# Patient Record
Sex: Male | Born: 1985 | Race: White | Hispanic: No | Marital: Married | Smoking: Never smoker
Health system: Southern US, Community
[De-identification: ages and names within clinical notes are randomized; demographics above are authoritative.]

## PROBLEM LIST (undated history)

## (undated) DIAGNOSIS — R0600 Dyspnea, unspecified: Secondary | ICD-10-CM

## (undated) DIAGNOSIS — R079 Chest pain, unspecified: Secondary | ICD-10-CM

## (undated) DIAGNOSIS — U071 COVID-19: Secondary | ICD-10-CM

## (undated) HISTORY — DX: Chest pain, unspecified: R07.9

## (undated) HISTORY — PX: WISDOM TOOTH EXTRACTION: SHX21

## (undated) HISTORY — DX: COVID-19: U07.1

## (undated) HISTORY — DX: Dyspnea, unspecified: R06.00

---

## 2013-12-26 ENCOUNTER — Ambulatory Visit (INDEPENDENT_AMBULATORY_CARE_PROVIDER_SITE_OTHER): Payer: 59 | Admitting: Physician Assistant

## 2013-12-26 VITALS — BP 110/68 | HR 67 | Temp 98.1°F | Resp 18 | Ht 70.5 in | Wt 155.0 lb

## 2013-12-26 DIAGNOSIS — M545 Low back pain, unspecified: Secondary | ICD-10-CM

## 2013-12-26 MED ORDER — TRAMADOL HCL 50 MG PO TABS: 50.0000 mg | ORAL_TABLET | Freq: Three times a day (TID) | ORAL | Status: DC | PRN

## 2013-12-26 MED ORDER — CYCLOBENZAPRINE HCL 10 MG PO TABS: 10.0000 mg | ORAL_TABLET | Freq: Three times a day (TID) | ORAL | Status: DC | PRN

## 2013-12-26 MED ORDER — MELOXICAM 15 MG PO TABS: 15.0000 mg | ORAL_TABLET | Freq: Every day | ORAL | Status: DC

## 2013-12-26 NOTE — Patient Instructions (Signed)
Take the meloxicam (Mobic) once daily - this will help with both pain and inflammation.  Do not take any additional advil or aleve with this medication as they work the same way, and I don't want to increase risk for adverse effects.  You can continue taking 1000mg  acetaminophen every 8 hours  Take the cyclobenzaprine (Flexeril) every 8 hours as needed.  This is a muscle relaxer.  It may make you sleepy, so be careful with the first dose.  If it makes you really sleepy, just use at bed time  Use the tramadol (Ultram) every 8 hours if needed for moderate to severe pain.  Ok to take on top of Mobic and Tylenol  I have put in referrals to see an orthopedic back specialist as well as a physical therapist

## 2013-12-26 NOTE — Progress Notes (Signed)
Subjective:    Patient ID: Donald Mcdaniel, male    DOB: December 01, 1985, 28 y.o.   MRN: [id number]   HPI   Donald Mcdaniel is a pleasant 28 yr old male here with concern for worsening low back pain.  Reports Donald Mcdaniel is having "pretty considerable" lower back pain.  This started in 2007 or 2008 when Donald Mcdaniel was in the [employer].  Has had ongoing low grade pain since this.  Worsening in the last 1-2 months.  Now with daily pain.  Pain is currently 2/10 but gets up to 7-8/10.  Donald Mcdaniel denies any injury within the last 2 months but states Donald Mcdaniel was being more active and lifting more.  Donald Mcdaniel denies radiation of pain.  No numbness, tingling, weakness.  No gait disturbance.  No change in bowel or bladder control.  Pain never completely goes away.  Worse with certain movements - putting on socks, rolling over in bed.  Lifting and bending make pain worse.  Donald Mcdaniel works as a Psychologist, occupationalwelder.  Donald Mcdaniel has taken 800mg  ibuprofen without relief.  Donald Mcdaniel gets some relief from 1000mg  tylenol   Review of Systems  Constitutional: Negative for fever and chills.  HENT: Negative.   Respiratory: Negative.   Cardiovascular: Negative.   Gastrointestinal: Negative.   Musculoskeletal: Positive for back pain. Negative for gait problem.  Skin: Negative.   Neurological: Negative for weakness and numbness.       Objective:   Physical Exam  Vitals reviewed. Constitutional: Donald Mcdaniel is oriented to person, place, and time. Donald Mcdaniel appears well-developed and well-nourished. No distress.  HENT:  Head: Normocephalic and atraumatic.  Eyes: Conjunctivae are normal. No scleral icterus.  Cardiovascular: Normal rate, regular rhythm and normal heart sounds.   Pulmonary/Chest: Effort normal and breath sounds normal. Donald Mcdaniel has no wheezes. Donald Mcdaniel has no rales.  Musculoskeletal:       Cervical back: Normal.       Thoracic back: Normal.       Lumbar back: Donald Mcdaniel exhibits decreased range of motion, tenderness, pain and spasm. Donald Mcdaniel exhibits no bony tenderness.  TTP over lumbar spine and paraspinals; flexion  significantly limited by pain; extension and rotation also limited but to a lesser extent  Neurological: Donald Mcdaniel is alert and oriented to person, place, and time. Donald Mcdaniel has normal strength. No sensory deficit.  Reflex Scores:      Bicep reflexes are 2+ on the right side and 2+ on the left side.      Patellar reflexes are 3+ on the right side and 3+ on the left side.      Achilles reflexes are 3+ on the right side. Skin: Skin is warm and dry.  Psychiatric: Donald Mcdaniel has a normal mood and affect. His behavior is normal.       Assessment & Plan:  Low back pain - Plan: meloxicam (MOBIC) 15 MG tablet, cyclobenzaprine (FLEXERIL) 10 MG tablet, traMADol (ULTRAM) 50 MG tablet, Ambulatory referral to Orthopedic Surgery, Ambulatory referral to Physical Therapy   Donald Mcdaniel is a pleasant 28 yr old male here with chronic low back pain, worsening in the last 2 months.  On exam there is tenderness over the low back.  His ROM is limited.  Donald Mcdaniel has no alarm symptoms.  Will start pt on Mobic, Flexeril, and tramadol prn.  Will refer to ortho spine as well as physical therapy.  Discussed with pt that I think an aggressive PT regimen may be very beneficial to him and help him achieve a better quality of life.  Donald Mcdaniel  agrees with this plan.  If worsening or not improving prior to referrals, Donald Mcdaniel will call or RTC   E. Frances Furbish MHS, PA-C [facility] 3/24/20159:38 PM

## 2016-07-19 ENCOUNTER — Encounter (HOSPITAL_COMMUNITY): Payer: Self-pay

## 2016-07-19 DIAGNOSIS — N451 Epididymitis: Secondary | ICD-10-CM | POA: Insufficient documentation

## 2016-07-19 LAB — LIPASE, BLOOD: Lipase: 24 U/L (ref 11–51)

## 2016-07-19 LAB — URINALYSIS, ROUTINE W REFLEX MICROSCOPIC
Bilirubin Urine: NEGATIVE
GLUCOSE, UA: NEGATIVE mg/dL
Hgb urine dipstick: NEGATIVE
Ketones, ur: 15 mg/dL — AB
Leukocytes, UA: NEGATIVE
NITRITE: NEGATIVE
Protein, ur: NEGATIVE mg/dL
Specific Gravity, Urine: 1.014 (ref 1.005–1.030)
pH: 7.5 (ref 5.0–8.0)

## 2016-07-19 LAB — COMPREHENSIVE METABOLIC PANEL
ALBUMIN: 4.6 g/dL (ref 3.5–5.0)
ALK PHOS: 48 U/L (ref 38–126)
ALT: 20 U/L (ref 17–63)
AST: 22 U/L (ref 15–41)
Anion gap: 12 (ref 5–15)
BILIRUBIN TOTAL: 1.7 mg/dL — AB (ref 0.3–1.2)
BUN: 10 mg/dL (ref 6–20)
CALCIUM: 9.9 mg/dL (ref 8.9–10.3)
CO2: 24 mmol/L (ref 22–32)
Chloride: 101 mmol/L (ref 101–111)
Creatinine, Ser: 0.97 mg/dL (ref 0.61–1.24)
GFR calc Af Amer: >60 mL/min (ref >60)
GFR calc non Af Amer: >60 mL/min (ref >60)
GLUCOSE: 115 mg/dL — AB (ref 65–99)
Potassium: 3.6 mmol/L (ref 3.5–5.1)
Sodium: 137 mmol/L (ref 135–145)
TOTAL PROTEIN: 8.1 g/dL (ref 6.5–8.1)

## 2016-07-19 LAB — CBC
HEMATOCRIT: 47.1 % (ref 39.0–52.0)
Hemoglobin: 16.9 g/dL (ref 13.0–17.0)
MCH: 30.5 pg (ref 26.0–34.0)
MCHC: 35.9 g/dL (ref 30.0–36.0)
MCV: 85 fL (ref 78.0–100.0)
Platelets: 189 10*3/uL (ref 150–400)
RBC: 5.54 MIL/uL (ref 4.22–5.81)
RDW: 12.2 % (ref 11.5–15.5)
WBC: 8.5 10*3/uL (ref 4.0–10.5)

## 2016-07-19 MED ORDER — FENTANYL CITRATE (PF) 100 MCG/2ML IJ SOLN
50.0000 ug | INTRAMUSCULAR | Status: AC | PRN
Start: 2016-07-19 — End: 2016-07-20
  Administered 2016-07-19 – 2016-07-20 (×2): 50 ug via NASAL

## 2016-07-19 MED ORDER — ONDANSETRON 4 MG PO TBDP
ORAL_TABLET | ORAL | Status: AC
  Filled 2016-07-19: qty 1

## 2016-07-19 MED ORDER — FENTANYL CITRATE (PF) 100 MCG/2ML IJ SOLN
INTRAMUSCULAR | Status: AC
  Filled 2016-07-19: qty 2

## 2016-07-19 MED ORDER — ONDANSETRON 4 MG PO TBDP
4.0000 mg | ORAL_TABLET | Freq: Once | ORAL | Status: AC | PRN
  Administered 2016-07-19: 4 mg via ORAL

## 2016-07-19 NOTE — ED Triage Notes (Signed)
Pt complaining of R upper abdominal pain. Pt state pain radiates to groin and testicles. Pt admits to pain with urination and multiple bowel movements today. Pt admits to nausea and vomiting today.

## 2016-07-20 ENCOUNTER — Emergency Department (HOSPITAL_COMMUNITY): Payer: Self-pay

## 2016-07-20 ENCOUNTER — Emergency Department (HOSPITAL_COMMUNITY)
Admission: EM | Admit: 2016-07-20 | Discharge: 2016-07-20 | Disposition: A | Discharge status: Home / Self Care | Payer: Self-pay | Attending: Emergency Medicine | Admitting: Emergency Medicine

## 2016-07-20 DIAGNOSIS — R1012 Left upper quadrant pain: Secondary | ICD-10-CM

## 2016-07-20 DIAGNOSIS — N451 Epididymitis: Secondary | ICD-10-CM

## 2016-07-20 MED ORDER — FENTANYL CITRATE (PF) 100 MCG/2ML IJ SOLN
INTRAMUSCULAR | Status: AC
  Filled 2016-07-20: qty 2

## 2016-07-20 MED ORDER — ONDANSETRON HCL 4 MG/2ML IJ SOLN
4.0000 mg | Freq: Once | INTRAMUSCULAR | Status: AC
  Administered 2016-07-20: 4 mg via INTRAVENOUS
  Filled 2016-07-20: qty 2

## 2016-07-20 MED ORDER — MORPHINE SULFATE (PF) 4 MG/ML IV SOLN
4.0000 mg | Freq: Once | INTRAVENOUS | Status: AC
  Administered 2016-07-20: 4 mg via INTRAVENOUS
  Filled 2016-07-20: qty 1

## 2016-07-20 MED ORDER — HYDROCODONE-ACETAMINOPHEN 5-325 MG PO TABS: 1.0000 | ORAL_TABLET | ORAL | 0 refills | Status: DC | PRN

## 2016-07-20 MED ORDER — IBUPROFEN 800 MG PO TABS: 800.0000 mg | ORAL_TABLET | Freq: Three times a day (TID) | ORAL | 0 refills | Status: DC

## 2016-07-20 MED ORDER — CEFTRIAXONE SODIUM 250 MG IJ SOLR
250.0000 mg | Freq: Once | INTRAMUSCULAR | Status: AC
  Administered 2016-07-20: 250 mg via INTRAMUSCULAR
  Filled 2016-07-20: qty 250

## 2016-07-20 MED ORDER — AZITHROMYCIN 250 MG PO TABS
1000.0000 mg | ORAL_TABLET | Freq: Once | ORAL | Status: AC
  Administered 2016-07-20: 1000 mg via ORAL
  Filled 2016-07-20: qty 4

## 2016-07-20 MED ORDER — SULFAMETHOXAZOLE-TRIMETHOPRIM 800-160 MG PO TABS: 1.0000 | ORAL_TABLET | Freq: Two times a day (BID) | ORAL | 0 refills | Status: AC

## 2016-07-20 MED ORDER — SODIUM CHLORIDE 0.9 % IV BOLUS (SEPSIS)
500.0000 mL | Freq: Once | INTRAVENOUS | Status: AC
  Administered 2016-07-20: 500 mL via INTRAVENOUS

## 2016-07-20 NOTE — ED Provider Notes (Signed)
[facility]-EMERGENCY DEPT Provider Note   CSN: [id number] Arrival date & time: 07/19/16  2202     History   Chief Complaint Chief Complaint  Patient presents with  . Abdominal Pain  . Groin Pain    HPI Donald Mcdaniel is a 30 y.o. male.  Patient presents to the emergency department for evaluation of abdominal pain. Symptoms began yesterday. Patient reports a constant burning pain in the left upper abdomen. He has had nausea and vomiting. Patient also reports some loose stools. Pain does radiate to the left groin area at times and he has noticed some burning with urination. No hematuria.      History reviewed. No pertinent past medical history.  There are no active problems to display for this patient.   Past Surgical History:  Procedure Laterality Date  . WISDOM TOOTH EXTRACTION         Home Medications    Prior to Admission medications   Medication Sig Start Date End Date Taking? Authorizing Provider  cyclobenzaprine (FLEXERIL) 10 MG tablet Take 1 tablet (10 mg total) by mouth 3 (three) times daily as needed for muscle spasms. 12/26/13   Eleanore Delia Chimes, PA-C  HYDROcodone-acetaminophen (NORCO/VICODIN) 5-325 MG tablet Take 1-2 tablets by mouth every 4 (four) hours as needed for moderate pain. 07/20/16   Gilda Crease, MD  ibuprofen (ADVIL,MOTRIN) 800 MG tablet Take 1 tablet (800 mg total) by mouth 3 (three) times daily. 07/20/16   Gilda Crease, MD  meloxicam (MOBIC) 15 MG tablet Take 1 tablet (15 mg total) by mouth daily. 12/26/13   Eleanore Delia Chimes, PA-C  sulfamethoxazole-trimethoprim (BACTRIM DS,SEPTRA DS) 800-160 MG tablet Take 1 tablet by mouth 2 (two) times daily. 07/20/16 07/27/16  Gilda Crease, MD  traMADol (ULTRAM) 50 MG tablet Take 1 tablet (50 mg total) by mouth every 8 (eight) hours as needed. 12/26/13   Godfrey Pick, PA-C    Family History Family History  Problem Relation Age of Onset  . Diabetes Mother   . Mental illness Mother   .  Hyperlipidemia Father   . Cancer Maternal Grandmother   . Diabetes Maternal Grandmother   . Cancer Paternal Grandfather     Social History Social History  Substance Use Topics  . Smoking status: Never Smoker  . Smokeless tobacco: Never Used  . Alcohol use Yes     Allergies   Review of patient's allergies indicates no known allergies.   Review of Systems Review of Systems  Gastrointestinal: Positive for abdominal pain, nausea and vomiting.  Genitourinary: Positive for dysuria and testicular pain. Negative for discharge, hematuria and penile pain.  All other systems reviewed and are negative.    Physical Exam Updated Vital Signs BP 127/86   Pulse 89   Temp 98.4 F (36.9 C) (Oral)   Resp 18   SpO2 98%   Physical Exam  Constitutional: He is oriented to person, place, and time. He appears well-developed and well-nourished. No distress.  HENT:  Head: Normocephalic and atraumatic.  Right Ear: Hearing normal.  Left Ear: Hearing normal.  Nose: Nose normal.  Mouth/Throat: Oropharynx is clear and moist and mucous membranes are normal.  Eyes: Conjunctivae and EOM are normal. Pupils are equal, round, and reactive to light.  Neck: Normal range of motion. Neck supple.  Cardiovascular: Regular rhythm, S1 normal and S2 normal.  Exam reveals no gallop and no friction rub.   No murmur heard. Pulmonary/Chest: Effort normal and breath sounds normal. No respiratory distress. He exhibits  no tenderness.  Abdominal: Soft. Normal appearance and bowel sounds are normal. There is no hepatosplenomegaly. There is no tenderness. There is no rebound, no guarding, no tenderness at McBurney's point and negative Murphy's sign. No hernia. Hernia confirmed negative in the right inguinal area and confirmed negative in the left inguinal area.  Genitourinary: Penis normal. Right testis shows no mass, no swelling and no tenderness. Left testis shows tenderness (Epididymis). Left testis shows no mass and no  swelling. Cremasteric reflex is not absent on the left side. Circumcised.  Musculoskeletal: Normal range of motion.  Neurological: He is alert and oriented to person, place, and time. He has normal strength. No cranial nerve deficit or sensory deficit. Coordination normal. GCS eye subscore is 4. GCS verbal subscore is 5. GCS motor subscore is 6.  Skin: Skin is warm, dry and intact. No rash noted. No cyanosis.  Psychiatric: He has a normal mood and affect. His speech is normal and behavior is normal. Thought content normal.  Nursing note and vitals reviewed.    ED Treatments / Results  Labs (all labs ordered are listed, but only abnormal results are displayed) Labs Reviewed  COMPREHENSIVE METABOLIC PANEL - Abnormal; Notable for the following:       Result Value   Glucose, Bld 115 (*)    Total Bilirubin 1.7 (*)    All other components within normal limits  URINALYSIS, ROUTINE W REFLEX MICROSCOPIC (NOT AT [facility]) - Abnormal; Notable for the following:    Ketones, ur 15 (*)    All other components within normal limits  LIPASE, BLOOD  CBC    EKG  EKG Interpretation None       Radiology Kore[place] Scrotum  Result Date: 07/20/2016 CLINICAL DATA:  30 y/o M; abdominal pain radiating to the testicles. EXAM: SCROTAL ULTRASOUND DOPPLER ULTRASOUND OF THE TESTICLES TECHNIQUE: Complete ultrasound examination of the testicles, epididymis, and other scrotal structures was performed. Color and spectral Doppler ultrasound were also utilized to evaluate blood flow to the testicles. COMPARISON:  None. FINDINGS: Right testicle Measurements: 4.9 x 2.5 x 3.4 cm. No mass or microlithiasis visualized. Left testicle Measurements: 4.9 x 2.6 x 3.1 cm. No mass or microlithiasis visualized. Right epididymis:  Normal in size and appearance. Left epididymis:  Normal in size and appearance. Hydrocele:  None visualized. Varicocele:  None visualized. Pulsed Doppler interrogation of both testes demonstrates normal low  resistance arterial and venous waveforms bilaterally. IMPRESSION: No acute process identified. No evidence of testicular torsion or epididymo-orchitis. Electronically Signed   By: Mitzi HansenLance  Furusawa-Stratton M.D.   On: 07/20/2016 06:52   [place] Art/ven Flow Abd Pelv Doppler  Result Date: 07/20/2016 CLINICAL DATA:  30 y/o M; abdominal pain radiating to the testicles. EXAM: SCROTAL ULTRASOUND DOPPLER ULTRASOUND OF THE TESTICLES TECHNIQUE: Complete ultrasound examination of the testicles, epididymis, and other scrotal structures was performed. Color and spectral Doppler ultrasound were also utilized to evaluate blood flow to the testicles. COMPARISON:  None. FINDINGS: Right testicle Measurements: 4.9 x 2.5 x 3.4 cm. No mass or microlithiasis visualized. Left testicle Measurements: 4.9 x 2.6 x 3.1 cm. No mass or microlithiasis visualized. Right epididymis:  Normal in size and appearance. Left epididymis:  Normal in size and appearance. Hydrocele:  None visualized. Varicocele:  None visualized. Pulsed Doppler interrogation of both testes demonstrates normal low resistance arterial and venous waveforms bilaterally. IMPRESSION: No acute process identified. No evidence of testicular torsion or epididymo-orchitis. Electronically Signed   By: Mitzi HansenLance  Furusawa-Stratton M.D.   On: 07/20/2016 06:52  Procedures Procedures (including critical care time)  Medications Ordered in ED Medications  ondansetron (ZOFRAN-ODT) 4 MG disintegrating tablet (not administered)  fentaNYL (SUBLIMAZE) 100 MCG/2ML injection (not administered)  ondansetron (ZOFRAN-ODT) disintegrating tablet 4 mg (4 mg Oral Given 07/19/16 2222)  fentaNYL (SUBLIMAZE) injection 50 mcg (50 mcg Nasal Given 07/20/16 0051)  sodium chloride 0.9 % bolus 500 mL (500 mLs Intravenous New Bag/Given 07/20/16 0557)  morphine 4 MG/ML injection 4 mg (4 mg Intravenous Given 07/20/16 0557)  ondansetron (ZOFRAN) injection 4 mg (4 mg Intravenous Given 07/20/16 0557)      Initial Impression / Assessment and Plan / ED Course  I have reviewed the triage vital signs and the nursing notes.  Pertinent labs & imaging results that were available during my care of the patient were reviewed by me and considered in my medical decision making (see chart for details).  Clinical Course   Patient presents with complaints of abdominal pain. Symptoms began yesterday. He initially had burning pain in his left upper abdomen that then became nausea, vomiting and diarrhea. Pain has become more diffuse across the abdomen and shoots down into the groin area. Examination did reveal some tenderness of the left epididymis region without any testicular swelling, redness or scrotal edema or erythema. Ultrasound shows no evidence of torsion. Ultrasound was essentially normal. Based on his examination, however, will treat for epididymitis. Patient has a benign abdominal exam. He had some mild tenderness in the left upper quadrant, no right upper quadrant tenderness, negative Murphy's sign. No lower abdominal tenderness, negative tenderness at McBurney's point. No signs of acute surgical process.  Final Clinical Impressions(s) / ED Diagnoses   Final diagnoses:  Left upper quadrant pain  Epididymitis    New Prescriptions New Prescriptions   HYDROCODONE-ACETAMINOPHEN (NORCO/VICODIN) 5-325 MG TABLET    Take 1-2 tablets by mouth every 4 (four) hours as needed for moderate pain.   IBUPROFEN (ADVIL,MOTRIN) 800 MG TABLET    Take 1 tablet (800 mg total) by mouth 3 (three) times daily.   SULFAMETHOXAZOLE-TRIMETHOPRIM (BACTRIM DS,SEPTRA DS) 800-160 MG TABLET    Take 1 tablet by mouth 2 (two) times daily.     Gilda Crease, MD 07/20/16 [phone]

## 2019-05-30 ENCOUNTER — Ambulatory Visit
Admission: RE | Admit: 2019-05-30 | Discharge: 2019-05-30 | Disposition: A | Discharge status: Home / Self Care | Payer: BC Managed Care – PPO | Source: Ambulatory Visit | Attending: Family Medicine | Admitting: Family Medicine

## 2019-05-30 ENCOUNTER — Other Ambulatory Visit: Payer: Self-pay | Admitting: Family Medicine

## 2019-05-30 DIAGNOSIS — R0689 Other abnormalities of breathing: Secondary | ICD-10-CM

## 2019-06-06 ENCOUNTER — Other Ambulatory Visit: Payer: Self-pay | Admitting: Family Medicine

## 2019-06-06 DIAGNOSIS — R079 Chest pain, unspecified: Secondary | ICD-10-CM

## 2019-06-06 DIAGNOSIS — R0600 Dyspnea, unspecified: Secondary | ICD-10-CM

## 2019-06-07 ENCOUNTER — Other Ambulatory Visit: Payer: Self-pay

## 2019-06-07 ENCOUNTER — Ambulatory Visit
Admission: RE | Admit: 2019-06-07 | Discharge: 2019-06-07 | Disposition: A | Discharge status: Home / Self Care | Payer: BC Managed Care – PPO | Source: Ambulatory Visit | Attending: Family Medicine | Admitting: Family Medicine

## 2019-06-07 DIAGNOSIS — R079 Chest pain, unspecified: Secondary | ICD-10-CM

## 2019-06-07 DIAGNOSIS — R0600 Dyspnea, unspecified: Secondary | ICD-10-CM

## 2019-06-07 MED ORDER — IOPAMIDOL (ISOVUE-300) INJECTION 61%
75.0000 mL | Freq: Once | INTRAVENOUS | Status: AC | PRN
  Administered 2019-06-07: 75 mL via INTRAVENOUS

## 2019-07-17 NOTE — Progress Notes (Signed)
CARDIOLOGY CONSULT NOTE       Patient ID: Donald Mcdaniel MRN: [id number] DOB/AGE: 04/10/1986 33 y.o.  Admit date: (Not on file) Referring Physician: Ehinger Primary Physician: Gaynelle Arabian, MD Primary Cardiologist: New Reason for Consultation: Chest pain   Active Problems:   * No active hospital problems. *   HPI:  33 y.o. referred by Dr Marisue Humble for chest pain. Has had chest pain and dyspnea since August CXR and labs negative including inflammatory markers ESR only 2 BNP 6 Notes resting HR higher than usual ? 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 Environmental work exposure ECG dated 07/12/18 NSR normal rate 60 no signs of pericarditis CT chest 06/07/19 also normal   Reviewed over 30 pages of records from Dr Marisue Humble including office notes labs ECG and CXR/CT from [employer] imaging time alone for this 30 minutes  ROS All other systems reviewed and negative except as noted above  No past medical history on file.  Family History  Problem Relation Age of Onset  . Diabetes Mother   . Mental illness Mother   . Hyperlipidemia Father   . Cancer Maternal Grandmother   . Diabetes Maternal Grandmother   . Cancer Paternal Grandfather     Social History   Socioeconomic History  . Marital status: Married    Spouse name: Not on file  . Number of children: Not on file  . Years of education: Not on file  . Highest education level: Not on file  Occupational History  . Not on file  Social Needs  . Financial resource strain: Not on file  . Food insecurity    Worry: Not on file    Inability: Not on file  . Transportation needs    Medical: Not on file    Non-medical: Not on file  Tobacco Use  . Smoking status: Never Smoker  . Smokeless  tobacco: Never Used  Substance and Sexual Activity  . Alcohol use: Yes  . Drug use: Not on file  . Sexual activity: Not on file  Lifestyle  . Physical activity    Days per week: Not on file    Minutes per session: Not on file  . Stress: Not on file  Relationships  . Social Herbalist on phone: Not on file    Gets together: Not on file    Attends religious service: Not on file    Active member of club or organization: Not on file    Attends meetings of clubs or organizations: Not on file    Relationship status: Not on file  . Intimate partner violence    Fear of current or ex partner: Not on file    Emotionally abused: Not on file    Physically abused: Not on file    Forced sexual activity: Not on file  Other Topics Concern  . Not on file  Social History Narrative  . Not on file    Past Surgical History:  Procedure Laterality Date  . WISDOM TOOTH EXTRACTION          Physical Exam: There were no vitals taken for this visit.    Affect appropriate Healthy:  appears stated age 33: normal Neck supple with no adenopathy JVP normal no  bruits no thyromegaly Lungs clear with no wheezing and good diaphragmatic motion Heart:  S1/S2 no murmur, no rub, gallop or click PMI normal Abdomen: benighn, BS positve, no tenderness, no AAA no bruit.  No HSM or HJR Distal pulses intact with no bruits No edema Neuro non-focal Skin warm and dry No muscular weakness   Labs:   Lab Results  Component Value Date   WBC 8.5 07/19/2016   HGB 16.9 07/19/2016   HCT 47.1 07/19/2016   MCV 85.0 07/19/2016   PLT 189 07/19/2016     Radiology: No results found.  EKG: 07/19/19 NSR rate 67 normal    ASSESSMENT AND PLAN:   1. Chest Pain:  Atypical normal ECG no signs of pericarditis normal ESR f/u ETT 2. Dyspnea:  Functional normal CXR, CT and ECG f/u echo to assess for DCM that may be viral etiology  3. Palpitations: benign no signs of arrhythmia ? Related to anxiety  TSH/Hct and other labs normal observe no need for monitor   Signed: Jenkins Rouge 07/17/2019, 11:59 AM

## 2019-07-19 ENCOUNTER — Ambulatory Visit: Payer: BC Managed Care – PPO | Admitting: Cardiovascular Disease

## 2019-07-19 ENCOUNTER — Encounter: Payer: Self-pay | Admitting: Cardiovascular Disease

## 2019-07-19 ENCOUNTER — Encounter (INDEPENDENT_AMBULATORY_CARE_PROVIDER_SITE_OTHER): Payer: Self-pay

## 2019-07-19 ENCOUNTER — Other Ambulatory Visit: Payer: Self-pay

## 2019-07-19 VITALS — BP 126/76 | HR 67 | Ht 70.5 in | Wt 176.0 lb

## 2019-07-19 DIAGNOSIS — R0609 Other forms of dyspnea: Secondary | ICD-10-CM

## 2019-07-19 DIAGNOSIS — R0600 Dyspnea, unspecified: Secondary | ICD-10-CM | POA: Diagnosis not present

## 2019-07-19 DIAGNOSIS — R079 Chest pain, unspecified: Secondary | ICD-10-CM | POA: Diagnosis not present

## 2019-07-19 NOTE — Addendum Note (Signed)
Addended by: Rose Phi on: 07/19/2019 04:55 PM   Modules accepted: Orders

## 2019-07-19 NOTE — Patient Instructions (Addendum)
Medication Instructions:   If you need a refill on your cardiac medications before your next appointment, please call your pharmacy.   Lab work:  If you have labs (blood work) drawn today and your tests are completely normal, you will receive your results only by: Marland Kitchen MyChart Message (if you have MyChart) OR . A paper copy in the mail If you have any lab test that is abnormal or we need to change your treatment, we will call you to review the results.  Testing/Procedures: Your physician has requested that you have an echocardiogram. Echocardiography is a painless test that uses sound waves to create images of your heart. It provides your doctor with information about the size and shape of your heart and how well your heart's chambers and valves are working. This procedure takes approximately one hour. There are no restrictions for this procedure.  Your physician has requested that you have an exercise tolerance test. For further information please visit [email or web address]. Please also follow instruction sheet, as given.  Follow-Up: At [facility], you and your health needs are our priority.  As part of our continuing mission to provide you with exceptional heart care, we have created designated Provider Care Teams.  These Care Teams include your primary Cardiologist (physician) and Advanced Practice Providers (APPs -  Physician Assistants and Nurse Practitioners) who all work together to provide you with the care you need, when you need it. . You will need a follow up appointment as needed with Dr. Johnsie Cancel.

## 2019-07-25 ENCOUNTER — Ambulatory Visit (HOSPITAL_COMMUNITY): Payer: BC Managed Care – PPO | Attending: Cardiology

## 2019-07-25 ENCOUNTER — Other Ambulatory Visit: Payer: Self-pay

## 2019-07-25 DIAGNOSIS — R0600 Dyspnea, unspecified: Secondary | ICD-10-CM | POA: Diagnosis present

## 2019-07-25 DIAGNOSIS — R0609 Other forms of dyspnea: Secondary | ICD-10-CM

## 2019-07-25 DIAGNOSIS — R079 Chest pain, unspecified: Secondary | ICD-10-CM | POA: Insufficient documentation

## 2019-08-07 ENCOUNTER — Other Ambulatory Visit (HOSPITAL_COMMUNITY)
Admission: RE | Admit: 2019-08-07 | Discharge: 2019-08-07 | Disposition: A | Discharge status: Home / Self Care | Payer: BC Managed Care – PPO | Source: Ambulatory Visit | Attending: Cardiovascular Disease | Admitting: Cardiovascular Disease

## 2019-08-07 DIAGNOSIS — Z01812 Encounter for preprocedural laboratory examination: Secondary | ICD-10-CM | POA: Insufficient documentation

## 2019-08-07 DIAGNOSIS — Z20828 Contact with and (suspected) exposure to other viral communicable diseases: Secondary | ICD-10-CM | POA: Diagnosis not present

## 2019-08-08 LAB — NOVEL CORONAVIRUS, NAA (HOSP ORDER, SEND-OUT TO REF LAB; TAT 18-24 HRS): SARS-CoV-2, NAA: NOT DETECTED

## 2019-08-09 ENCOUNTER — Telehealth (HOSPITAL_COMMUNITY): Payer: Self-pay

## 2019-08-09 NOTE — Telephone Encounter (Signed)
Encounter complete. 

## 2019-08-10 ENCOUNTER — Other Ambulatory Visit: Payer: Self-pay

## 2019-08-10 ENCOUNTER — Ambulatory Visit (HOSPITAL_COMMUNITY)
Admission: RE | Admit: 2019-08-10 | Discharge: 2019-08-10 | Disposition: A | Discharge status: Home / Self Care | Payer: BC Managed Care – PPO | Source: Ambulatory Visit | Attending: Cardiology | Admitting: Cardiology

## 2019-08-10 DIAGNOSIS — R0600 Dyspnea, unspecified: Secondary | ICD-10-CM | POA: Diagnosis present

## 2019-08-10 DIAGNOSIS — R0609 Other forms of dyspnea: Secondary | ICD-10-CM

## 2019-08-10 DIAGNOSIS — R079 Chest pain, unspecified: Secondary | ICD-10-CM | POA: Diagnosis present

## 2019-08-11 LAB — EXERCISE TOLERANCE TEST
Estimated workload: 17.2 METS
Exercise duration (min): 14 min
Exercise duration (sec): 0 s
MPHR: 187 {beats}/min
Peak HR: 179 {beats}/min
Percent HR: 95 %
Rest HR: 73 {beats}/min

## 2021-02-25 ENCOUNTER — Other Ambulatory Visit: Payer: Self-pay

## 2021-02-25 ENCOUNTER — Ambulatory Visit (HOSPITAL_COMMUNITY)
Admission: EM | Admit: 2021-02-25 | Discharge: 2021-02-25 | Disposition: A | Discharge status: Home / Self Care | Payer: Self-pay | Attending: Family | Admitting: Family

## 2021-02-25 ENCOUNTER — Encounter (HOSPITAL_COMMUNITY): Payer: Self-pay

## 2021-02-25 ENCOUNTER — Ambulatory Visit (INDEPENDENT_AMBULATORY_CARE_PROVIDER_SITE_OTHER): Payer: Self-pay

## 2021-02-25 DIAGNOSIS — M25512 Pain in left shoulder: Secondary | ICD-10-CM

## 2021-02-25 DIAGNOSIS — S43102A Unspecified dislocation of left acromioclavicular joint, initial encounter: Secondary | ICD-10-CM
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
## 2021-02-25 NOTE — Discharge Instructions (Addendum)
Recommend continue to wear left shoulder/arm sling. May continue Ibuprofen 800mg  every 8 to 12 hours as needed for pain. May continue applying ice to area and alternate with heat for comfort. Call [facility] today to schedule appointment for follow-up.

## 2022-06-18 ENCOUNTER — Encounter (HOSPITAL_COMMUNITY): Payer: Self-pay | Admitting: *Deleted

## 2022-06-18 ENCOUNTER — Other Ambulatory Visit: Payer: Self-pay

## 2022-06-18 ENCOUNTER — Emergency Department (HOSPITAL_COMMUNITY): Payer: Self-pay

## 2022-06-18 ENCOUNTER — Observation Stay (HOSPITAL_COMMUNITY)
Admission: EM | Admit: 2022-06-18 | Discharge: 2022-06-19 | Disposition: A | Discharge status: Home / Self Care | Payer: No Typology Code available for payment source | Attending: Family Medicine | Admitting: Family Medicine

## 2022-06-18 ENCOUNTER — Emergency Department (HOSPITAL_COMMUNITY): Payer: No Typology Code available for payment source

## 2022-06-18 DIAGNOSIS — X500XXA Overexertion from strenuous movement or load, initial encounter: Secondary | ICD-10-CM | POA: Diagnosis not present

## 2022-06-18 DIAGNOSIS — Z7982 Long term (current) use of aspirin: Secondary | ICD-10-CM | POA: Diagnosis not present

## 2022-06-18 DIAGNOSIS — M545 Low back pain, unspecified: Secondary | ICD-10-CM | POA: Diagnosis not present

## 2022-06-18 DIAGNOSIS — Z79899 Other long term (current) drug therapy: Secondary | ICD-10-CM | POA: Insufficient documentation

## 2022-06-18 DIAGNOSIS — Z23 Encounter for immunization: Secondary | ICD-10-CM | POA: Diagnosis not present

## 2022-06-18 DIAGNOSIS — Z8616 Personal history of COVID-19: Secondary | ICD-10-CM | POA: Insufficient documentation

## 2022-06-18 LAB — BASIC METABOLIC PANEL
Anion gap: 10 (ref 5–15)
BUN: 18 mg/dL (ref 6–20)
CO2: 24 mmol/L (ref 22–32)
Calcium: 9.5 mg/dL (ref 8.9–10.3)
Chloride: 105 mmol/L (ref 98–111)
Creatinine, Ser: 1.05 mg/dL (ref 0.61–1.24)
GFR, Estimated: >60 mL/min (ref >60)
Glucose, Bld: 92 mg/dL (ref 70–99)
Potassium: 3.7 mmol/L (ref 3.5–5.1)
Sodium: 139 mmol/L (ref 135–145)

## 2022-06-18 LAB — CBC WITH DIFFERENTIAL/PLATELET
Abs Immature Granulocytes: 0 10*3/uL (ref 0.00–0.07)
Basophils Absolute: 0 10*3/uL (ref 0.0–0.1)
Basophils Relative: 1 %
Eosinophils Absolute: 0 10*3/uL (ref 0.0–0.5)
Eosinophils Relative: 1 %
HCT: 46.2 % (ref 39.0–52.0)
Hemoglobin: 15.9 g/dL (ref 13.0–17.0)
Immature Granulocytes: 0 %
Lymphocytes Relative: 25 %
Lymphs Abs: 1.5 10*3/uL (ref 0.7–4.0)
MCH: 30.2 pg (ref 26.0–34.0)
MCHC: 34.4 g/dL (ref 30.0–36.0)
MCV: 87.7 fL (ref 80.0–100.0)
Monocytes Absolute: 0.4 10*3/uL (ref 0.1–1.0)
Monocytes Relative: 6 %
Neutro Abs: 4.1 10*3/uL (ref 1.7–7.7)
Neutrophils Relative %: 67 %
Platelets: 178 10*3/uL (ref 150–400)
RBC: 5.27 MIL/uL (ref 4.22–5.81)
RDW: 12.5 % (ref 11.5–15.5)
WBC: 6 10*3/uL (ref 4.0–10.5)
nRBC: 0 % (ref 0.0–0.2)

## 2022-06-18 MED ORDER — IBUPROFEN 600 MG PO TABS: 600.0000 mg | ORAL_TABLET | Freq: Four times a day (QID) | ORAL | 0 refills | Status: AC | PRN

## 2022-06-18 MED ORDER — METHOCARBAMOL 1000 MG/10ML IJ SOLN
500.0000 mg | Freq: Three times a day (TID) | INTRAVENOUS | Status: DC
  Administered 2022-06-18 – 2022-06-19 (×2): 500 mg via INTRAVENOUS
  Filled 2022-06-18: qty 5

## 2022-06-18 MED ORDER — ONDANSETRON HCL 4 MG PO TABS: 4.0000 mg | ORAL_TABLET | Freq: Four times a day (QID) | ORAL | Status: DC | PRN

## 2022-06-18 MED ORDER — DEXAMETHASONE SODIUM PHOSPHATE 10 MG/ML IJ SOLN
10.0000 mg | Freq: Once | INTRAMUSCULAR | Status: AC
  Administered 2022-06-18: 10 mg via INTRAVENOUS
  Filled 2022-06-18: qty 1

## 2022-06-18 MED ORDER — HYDROCODONE-ACETAMINOPHEN 5-325 MG PO TABS
1.0000 | ORAL_TABLET | ORAL | Status: DC | PRN
  Administered 2022-06-18 – 2022-06-19 (×2): 2 via ORAL
  Filled 2022-06-18 (×2): qty 2

## 2022-06-18 MED ORDER — ENOXAPARIN SODIUM 40 MG/0.4ML IJ SOSY: 40.0000 mg | PREFILLED_SYRINGE | INTRAMUSCULAR | Status: DC

## 2022-06-18 MED ORDER — HYDROCODONE-ACETAMINOPHEN 5-325 MG PO TABS
2.0000 | ORAL_TABLET | Freq: Once | ORAL | Status: AC
  Administered 2022-06-18: 2 via ORAL
  Filled 2022-06-18: qty 2

## 2022-06-18 MED ORDER — KETOROLAC TROMETHAMINE 15 MG/ML IJ SOLN
15.0000 mg | Freq: Once | INTRAMUSCULAR | Status: AC
  Administered 2022-06-18: 15 mg via INTRAMUSCULAR
  Filled 2022-06-18: qty 1

## 2022-06-18 MED ORDER — FENTANYL CITRATE PF 50 MCG/ML IJ SOSY
50.0000 ug | PREFILLED_SYRINGE | Freq: Once | INTRAMUSCULAR | Status: AC
  Administered 2022-06-18: 50 ug via INTRAVENOUS
  Filled 2022-06-18: qty 1

## 2022-06-18 MED ORDER — METHOCARBAMOL 1000 MG/10ML IJ SOLN
INTRAMUSCULAR | Status: AC
  Filled 2022-06-18: qty 10

## 2022-06-18 MED ORDER — PANTOPRAZOLE SODIUM 40 MG PO TBEC
40.0000 mg | DELAYED_RELEASE_TABLET | Freq: Every day | ORAL | Status: DC
  Administered 2022-06-18 – 2022-06-19 (×2): 40 mg via ORAL
  Filled 2022-06-18 (×2): qty 1

## 2022-06-18 MED ORDER — KETOROLAC TROMETHAMINE 30 MG/ML IJ SOLN
30.0000 mg | Freq: Four times a day (QID) | INTRAMUSCULAR | Status: DC
  Administered 2022-06-18 – 2022-06-19 (×3): 30 mg via INTRAVENOUS
  Filled 2022-06-18 (×3): qty 1

## 2022-06-18 MED ORDER — PREDNISONE 20 MG PO TABS
40.0000 mg | ORAL_TABLET | Freq: Every day | ORAL | Status: DC
  Administered 2022-06-19: 40 mg via ORAL
  Filled 2022-06-18: qty 2

## 2022-06-18 MED ORDER — DICLOFENAC SODIUM 1 % EX GEL
2.0000 g | Freq: Four times a day (QID) | CUTANEOUS | Status: DC
  Administered 2022-06-18 – 2022-06-19 (×3): 2 g via TOPICAL
  Filled 2022-06-18 (×2): qty 100

## 2022-06-18 MED ORDER — ONDANSETRON HCL 4 MG/2ML IJ SOLN: 4.0000 mg | Freq: Four times a day (QID) | INTRAMUSCULAR | Status: DC | PRN

## 2022-06-18 MED ORDER — CYCLOBENZAPRINE HCL 10 MG PO TABS: 10.0000 mg | ORAL_TABLET | Freq: Two times a day (BID) | ORAL | 0 refills | Status: DC | PRN

## 2022-06-18 MED ORDER — HYDROMORPHONE HCL 1 MG/ML IJ SOLN
0.5000 mg | Freq: Once | INTRAMUSCULAR | Status: AC
  Administered 2022-06-18: 0.5 mg via INTRAVENOUS
  Filled 2022-06-18: qty 0.5

## 2022-06-18 MED ORDER — DOCUSATE SODIUM 100 MG PO CAPS
100.0000 mg | ORAL_CAPSULE | Freq: Two times a day (BID) | ORAL | Status: DC
  Administered 2022-06-18 – 2022-06-19 (×2): 100 mg via ORAL
  Filled 2022-06-18 (×2): qty 1

## 2022-06-18 MED ORDER — LIDOCAINE 5 % EX PTCH
1.0000 | MEDICATED_PATCH | CUTANEOUS | Status: DC
  Administered 2022-06-18 – 2022-06-19 (×2): 1 via TRANSDERMAL
  Filled 2022-06-18 (×2): qty 1

## 2022-06-18 MED ORDER — METHOCARBAMOL 1000 MG/10ML IJ SOLN
1000.0000 mg | Freq: Once | INTRAVENOUS | Status: AC
  Administered 2022-06-18: 1000 mg via INTRAVENOUS
  Filled 2022-06-18: qty 10

## 2022-06-18 MED ORDER — LIDOCAINE 5 % EX PTCH: 1.0000 | MEDICATED_PATCH | CUTANEOUS | 0 refills | Status: AC

## 2022-06-18 NOTE — ED Notes (Signed)
Pt has returned from MRI. 

## 2022-06-18 NOTE — Discharge Instructions (Addendum)
This is likely a muscular strain or spasm.  This will get better with time.  Please take 600 mg ibuprofen every 6 hours as needed for pain.  You may also use lidocaine patches and muscle relaxers.  Do not operate heavy machinery or mix with alcohol when taking muscle relaxers that can make you drowsy.  Please follow-up with your primary care provider or return to the emergency department for any worsening symptoms.

## 2022-06-18 NOTE — H&P (Signed)
[facility] H&P   Patient Demographics:    Donald Mcdaniel, is a 36 y.o. male  MRN: [id number]   DOB - 01-26-1986  Admit Date - 06/18/2022  Outpatient Primary MD for the patient is Blair Heys, MD  Referring MD/NP/PA: PA Meredeth Ide  Patient coming from: home  Chief Complaint  Patient presents with   Back Pain      HPI:    Donald Mcdaniel  is a 36 y.o. male, without significant past medical history, who works as Therapist, nutritional, he was lifting one of the medical bags, all of a sudden he got immediate burning, painful sensation in the right lower middle back, radiating down to the right leg, did not hear any popping sensation, for extreme pain when he did walk around 20 steps, where he almost had to lower himself to his knees secondary to pain, he denies any focal weakness, tingling or numbness, no bladder incontinence, fever, chills, no bowel incontinence, given extreme pain he had to come to ED. -In ED MRI significant for mild degenerative disease, he did receive IV Dilaudid, IV Robaxin, Vicodin, IV Decadron, and IV Toradol, despite that remains with significant pain, so [facility] hospital[facility] consulted to admit    Review of systems:    A full 10 point Review of Systems was done, except as stated above, all other Review of Systems were negative.   With Past History of the following :    Past Medical History:  Diagnosis Date   Chest pain    COVID-19    Dyspnea       Past Surgical History:  Procedure Laterality Date   WISDOM TOOTH EXTRACTION        Social History:     Social History   Tobacco Use   Smoking status: Never   Smokeless tobacco: Never  Substance Use Topics   Alcohol use: Yes    Comment: occasionally        Family History :     Family History  Problem Relation Age of Onset   Diabetes Mother    Mental illness Mother    Hyperlipidemia Father    Cancer  Maternal Grandmother    Diabetes Maternal Grandmother    Cancer Paternal Grandfather       Home Medications:   Prior to Admission medications   Medication Sig Start Date End Date Taking? Authorizing Provider  aspirin EC 81 MG tablet Take 81 mg by mouth 2 (two) times daily as needed for mild pain.   Yes [provider]  cyclobenzaprine (FLEXERIL) 10 MG tablet Take 1 tablet (10 mg total) by mouth 2 (two) times daily as needed for muscle spasms. 06/18/22  Yes Meredeth Ide, Conner M, PA-C  ibuprofen (ADVIL) 600 MG tablet Take 1 tablet (600 mg total) by mouth every 6 (six) hours as needed. 06/18/22  Yes Fleming, Conner M, PA-C  lidocaine (LIDODERM) 5 % Place 1  patch onto the skin daily. Remove & Discard patch within 12 hours or as directed by MD 06/18/22  Yes Meredeth Ide, Conner M, PA-C  naproxen sodium (ALEVE) 220 MG tablet Take 220-440 mg by mouth 2 (two) times daily as needed (for pain).   Yes [provider]     Allergies:    No Known Allergies   Physical Exam:   Vitals  Blood pressure 114/74, pulse 64, temperature 97.6 F (36.4 C), temperature source Oral, resp. rate 16, height 5\' 10"  (1.778 m), weight 83.9 kg, SpO2 100 %.   1. General developed male, laying in bed, in mild discomfort due to lower back pain  2. Normal affect and insight, Not Suicidal or Homicidal, Awake Alert, Oriented X 3.  3. No F.N deficits, ALL C.Nerves Intact, Strength 5/5 all 4 extremities, Sensation intact all 4 extremities, Plantars down going.  4. Ears and Eyes appear Normal, Conjunctivae clear, PERRLA. Moist Oral Mucosa.  5. Supple Neck, No JVD,  6. Symmetrical Chest wall movement, Good air movement bilaterally, CTAB.  7. RRR,   8. Positive Bowel Sounds, Abdomen Soft,  9.  No Cyanosis, Normal Skin Turgor, No Skin Rash or Bruise.  10. Good muscle tone,  joints appear normal , lower back is not tender to palpation, negative leg raise test.    Data Review:    CBC Recent Labs  Lab  06/18/22 1139  WBC 6.0  HGB 15.9  HCT 46.2  PLT 178  MCV 87.7  MCH 30.2  MCHC 34.4  RDW 12.5  LYMPHSABS 1.5  MONOABS 0.4  EOSABS 0.0  BASOSABS 0.0   ------------------------------------------------------------------------------------------------------------------  Chemistries  Recent Labs  Lab 06/18/22 1139  NA 139  K 3.7  CL 105  CO2 24  GLUCOSE 92  BUN 18  CREATININE 1.05  CALCIUM 9.5   ------------------------------------------------------------------------------------------------------------------ estimated creatinine clearance is 101.4 mL/min (by C-G formula based on SCr of 1.05 mg/dL). ------------------------------------------------------------------------------------------------------------------ No results for input(s): "TSH", "T4TOTAL", "T3FREE", "THYROIDAB" in the last 72 hours.  Invalid input(s): "FREET3"  Coagulation profile No results for input(s): "INR", "PROTIME" in the last 168 hours. ------------------------------------------------------------------------------------------------------------------- No results for input(s): "DDIMER" in the last 72 hours. -------------------------------------------------------------------------------------------------------------------  Cardiac Enzymes No results for input(s): "CKMB", "TROPONINI", "MYOGLOBIN" in the last 168 hours.  Invalid input(s): "CK" ------------------------------------------------------------------------------------------------------------------ No results found for: "BNP"   ---------------------------------------------------------------------------------------------------------------  Urinalysis    Component Value Date/Time   COLORURINE YELLOW 07/19/2016 2227   APPEARANCEUR CLEAR 07/19/2016 2227   LABSPEC 1.014 07/19/2016 2227   PHURINE 7.5 07/19/2016 2227   GLUCOSEU NEGATIVE 07/19/2016 2227   HGBUR NEGATIVE 07/19/2016 2227   BILIRUBINUR NEGATIVE 07/19/2016 2227   KETONESUR 15 (A)  07/19/2016 2227   PROTEINUR NEGATIVE 07/19/2016 2227   NITRITE NEGATIVE 07/19/2016 2227   LEUKOCYTESUR NEGATIVE 07/19/2016 2227    ----------------------------------------------------------------------------------------------------------------   Imaging Results:    MR LUMBAR SPINE WO CONTRAST  Result Date: 06/18/2022 CLINICAL DATA:  Low back pain, cauda equina syndrome suspected EXAM: MRI LUMBAR SPINE WITHOUT CONTRAST TECHNIQUE: Multiplanar, multisequence MR imaging of the lumbar spine was performed. No intravenous contrast was administered. COMPARISON:  None Available. FINDINGS: Segmentation:  Standard. Alignment: Straightening of lumbar lordosis. No significant anteroposterior listhesis. Vertebrae: Vertebral body heights are maintained. No marrow edema. No suspicious osseous lesion. Conus medullaris and cauda equina: Conus extends to the L2 level. Conus and cauda equina appear normal. Paraspinal and other soft tissues: Unremarkable. Disc levels: Intervertebral disc heights and signal are maintained from L1-L2 through L4-L5. No disc herniation or  stenosis at these levels. L5-S1: Disc desiccation and minor height loss. Shallow left central/subarticular protrusion superimposed on a minimal disc bulge. No canal or foraminal stenosis. Slight narrowing of the left subarticular recess without definite nerve root compression. 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 Electronically Signed   By: Guadlupe Spanish M.D.   On: 06/18/2022 13:05   DG Lumbar Spine Complete  Result Date: 06/18/2022 CLINICAL DATA:  Low back pain. 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 EXAM: LUMBAR SPINE - COMPLETE 4+ VIEW COMPARISON:  None Available. FINDINGS: There is no evidence of lumbar spine fracture. Straightening of the lumbar spine. Intervertebral disc spaces are maintained. IMPRESSION: 1. No acute fracture or traumatic subluxation.  2. Straightening of the lumbar spine, which could be postural. Electronically Signed   By: Larose Hires D.O.   On: 06/18/2022 09:59       Assessment & Plan:    Principal Problem:   Lumbago   Lumbago / intractable lower back pain -Patient presents with significant lower back pain, Vere, no improvement despite multiple medications including Vicodin, IV Dilaudid, IV Robaxin, IV Decadron and IV Toradol -He still having difficulty walking due to pain (no focal deficits), so he will be admitted overnight for better pain control, will keep him on scheduled IV Robaxin 500 mg every 8 hours, scheduled IV Toradol 30 mg IV every 6 hours (will keep on PPI for GI prophylaxis), and he will be kept on as needed oxycodone and IV Dilaudid as well, will start on Voltaren gel at lower back area. -Overall number spine MRI is reassuring, significant only for mild degenerative disc disease at L5-S1., no evidence of cauda equina or any cord compression, as any bowel or bladder incontinence. -Did receive IV Decadron, will keep on short steroid taper.  DVT Prophylaxis  Lovenox   AM Labs Ordered, also please review Full Orders  Family Communication: Admission, patients condition and plan of care including tests being ordered have been discussed with the patient who indicate understanding and agree with the plan and Code Status.  Code Status Full  Likely DC to  home  Condition GUARDED    Consults called: none    Admission status: observation    Time spent in minutes : 40 minutes   Huey Bienenstock M.D on 06/18/2022 at 5:09 PM   [facility] - Office  [phone]

## 2022-06-18 NOTE — ED Provider Notes (Cosign Needed Addendum)
[facility] EMERGENCY DEPARTMENT Provider Note   CSN: [id number] Arrival date & time: 06/18/22  0818     History Chief Complaint  Patient presents with   Back Pain    Donald Mcdaniel is a 36 y.o. male patient who presents to the emergency department with acute lower back pain that started just prior to arrival.  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  He did not hear any popping sensation.  He states that he was able to walk approximately 15-20 steps immediately after the incident but had to lower himself to his knees secondary to the pain.  He denies any focal weakness or numbness.  Denies any bowel or bladder incontinence, fever, IV drug use.   Back Pain      Home Medications Prior to Admission medications   Medication Sig Start Date End Date Taking? Authorizing Provider  aspirin EC 81 MG tablet Take 81 mg by mouth 2 (two) times daily as needed for mild pain.   Yes [provider]  cyclobenzaprine (FLEXERIL) 10 MG tablet Take 1 tablet (10 mg total) by mouth 2 (two) times daily as needed for muscle spasms. 06/18/22  Yes Meredeth Ide, Josephmichael Lisenbee M, PA-C  ibuprofen (ADVIL) 600 MG tablet Take 1 tablet (600 mg total) by mouth every 6 (six) hours as needed. 06/18/22  Yes Jamie Belger M, PA-C  lidocaine (LIDODERM) 5 % Place 1 patch onto the skin daily. Remove & Discard patch within 12 hours or as directed by MD 06/18/22  Yes Meredeth Ide, Yilin Weedon M, PA-C  naproxen sodium (ALEVE) 220 MG tablet Take 220-440 mg by mouth 2 (two) times daily as needed (for pain).   Yes [provider]      Allergies    Patient has no known allergies.    Review of Systems   Review of Systems  Musculoskeletal:  Positive for back pain.  All other systems reviewed and are negative.   Physical Exam Updated Vital Signs BP 116/74 (BP Location: Right Arm)   Pulse (!) 58   Temp 97.6  F (36.4 C) (Oral)   Resp 15   Ht 5\' 10"  (1.778 m)   Wt 83.9 kg   SpO2 100%   BMI 26.54 kg/m  Physical Exam Vitals and nursing note reviewed.  Constitutional:      Appearance: Normal appearance.  HENT:     Head: Normocephalic and atraumatic.  Eyes:     General:        Right eye: No discharge.        Left eye: No discharge.     Conjunctiva/sclera: Conjunctivae normal.  Pulmonary:     Effort: Pulmonary effort is normal.  Musculoskeletal:     Comments: There is midline lumbar tenderness.  Equal strength but difficult to assess secondary to pain on the lower extremities.  Neurologically intact.  2+ dorsalis pedis pulse felt in the bilateral feet which are symmetrical.  Skin:    General: Skin is warm and dry.     Findings: No rash.  Neurological:     General: No focal deficit present.     Mental Status: He is alert.  Psychiatric:        Mood and Affect: Mood normal.        Behavior: Behavior normal.     ED Results / Procedures / Treatments   Labs (all labs ordered  are listed, but only abnormal results are displayed) Labs Reviewed  CBC WITH DIFFERENTIAL/PLATELET  BASIC METABOLIC PANEL    EKG None  Radiology DG Lumbar Spine Complete  Result Date: 06/18/2022 CLINICAL DATA:  Low back pain. 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 EXAM: LUMBAR SPINE - COMPLETE 4+ VIEW COMPARISON:  None Available. FINDINGS: There is no evidence of lumbar spine fracture. Straightening of the lumbar spine. Intervertebral disc spaces are maintained. IMPRESSION: 1. No acute fracture or traumatic subluxation. 2. Straightening of the lumbar spine, which could be postural. Electronically Signed   By: Larose Hires D.O.   On: 06/18/2022 09:59    Procedures Procedures    Medications Ordered in ED Medications  lidocaine (LIDODERM) 5 % 1 patch (1 patch Transdermal Patch Applied 06/18/22 0951)  fentaNYL (SUBLIMAZE)  injection 50 mcg (has no administration in time range)  ketorolac (TORADOL) 15 MG/ML injection 15 mg (15 mg Intramuscular Given 06/18/22 0946)  HYDROcodone-acetaminophen (NORCO/VICODIN) 5-325 MG per tablet 2 tablet (2 tablets Oral Given 06/18/22 0946)    ED Course/ Medical Decision Making/ A&P Clinical Course as of 06/18/22 1705  Thu Jun 18, 2022  1043 DG Lumbar Spine Complete Normal.  Did not see any evidence of fracture dislocations.  I personally ordered and interpreted this study.  I do agree with the radiologist interpretation. [CF]  1104 On repeat evaluation, patient states he is doing better.  His back pain is greatly improved after pain medications.  I went over imaging with him at the bedside.  All questions or concerns addressed. [CF]  1142 On discharge, patient was unable to walk without assistance.  When asked, he states he cannot do it.  He states he is not having a ton of pain with the pain medication but he states that he cannot lift his leg up.  Will get basic labs and MRI without contrast and give him some IV pain medication. [CF]  1528 On reevaluation, when checked on the patient after he got half milligram of Dilaudid.  He states his back pain is still severe.  He is unable to sit up in the bed nor ambulate on his own.  Likely to get admitted for pain control. [CF]  1537 I spoke with Dr. Randol Kern who put in for 1 g of Robaxin.  If this does not work he recommends a phone call back for admission. [CF]  1705 After reassessment, patient is more comfortable but after speaking with Dr. Randol Kern again we will keep the patient overnight for observation. [CF]  1705 CBC with Differential Normal. [CF]  1705 Basic metabolic panel Normal. [CF]  1705 DG Lumbar Spine Complete I do not see any evidence of fracture or dislocation.  I personally ordered and interpreted this image. [CF]  1705 MR LUMBAR SPINE WO CONTRAST I do not see any evidence of cord compression.  I personally ordered and  interpreted this study.  I do agree with the radiologist interpretation. [CF]    Clinical Course User Index [CF] Teressa Lower, PA-C                           Medical Decision Making Amandeep Nesmith is a 36 y.o. male patient who presents to the emergency department today for further evaluation of lower back pain.  This is likely muscular spasm.  Given the pain, I will get an x-ray  of the lower back to evaluate for any bony abnormalities.  I have a low suspicion for any epidural abscess or cauda equina syndrome at this time.  I will plan to give him some pain medication and reassess.  Patient feeling better after pain medication.  This is likely a muscular spasm.  X-ray was normal.   As highlighted in ED course, ultimately we got an MRI because the patient was unable to walk.  MRI was reassuring however, patient still having a significant amount of pain and I feel the patient would likely benefit from further evaluation in the hospital for additional analgesia.  Patient amenable this plan.  I will get him admitted to the hospitalist service.  Amount and/or Complexity of Data Reviewed Labs: ordered. Decision-making details documented in ED Course. Radiology: ordered. Decision-making details documented in ED Course.  Risk Prescription drug management. Decision regarding hospitalization.   Final Clinical Impression(s) / ED Diagnoses Final diagnoses:  Acute midline low back pain, unspecified whether sciatica present    Rx / DC Orders ED Discharge Orders          Ordered    cyclobenzaprine (FLEXERIL) 10 MG tablet  2 times daily PRN        06/18/22 1105    ibuprofen (ADVIL) 600 MG tablet  Every 6 hours PRN        06/18/22 1105    lidocaine (LIDODERM) 5 %  Every 24 hours        06/18/22 1105              Honor Loh [place], PA-C 06/18/22 [address] [place], PA-C 06/18/22 1707    Bethann Berkshire, MD 06/21/22 [phone]

## 2022-06-18 NOTE — ED Triage Notes (Signed)
Pt states he was picking [place] his medical bag and felt sharp pain down his right leg  Pt states he tried to walk it off but was unable to walk and went down to his knees

## 2022-06-18 NOTE — ED Notes (Signed)
Pt given graham crackers, peanut butter, and apple sauce per RN.

## 2022-06-19 DIAGNOSIS — M545 Low back pain, unspecified: Secondary | ICD-10-CM | POA: Diagnosis not present

## 2022-06-19 LAB — BASIC METABOLIC PANEL
Anion gap: 7 (ref 5–15)
BUN: 19 mg/dL (ref 6–20)
CO2: 25 mmol/L (ref 22–32)
Calcium: 9.2 mg/dL (ref 8.9–10.3)
Chloride: 106 mmol/L (ref 98–111)
Creatinine, Ser: 0.93 mg/dL (ref 0.61–1.24)
GFR, Estimated: >60 mL/min (ref >60)
Glucose, Bld: 129 mg/dL — ABNORMAL HIGH (ref 70–99)
Potassium: 5 mmol/L (ref 3.5–5.1)
Sodium: 138 mmol/L (ref 135–145)

## 2022-06-19 LAB — HIV ANTIBODY (ROUTINE TESTING W REFLEX): HIV Screen 4th Generation wRfx: NONREACTIVE

## 2022-06-19 LAB — CBC
HCT: 46.3 % (ref 39.0–52.0)
Hemoglobin: 15.9 g/dL (ref 13.0–17.0)
MCH: 30.2 pg (ref 26.0–34.0)
MCHC: 34.3 g/dL (ref 30.0–36.0)
MCV: 88 fL (ref 80.0–100.0)
Platelets: 176 10*3/uL (ref 150–400)
RBC: 5.26 MIL/uL (ref 4.22–5.81)
RDW: 12.2 % (ref 11.5–15.5)
WBC: 8.2 10*3/uL (ref 4.0–10.5)
nRBC: 0 % (ref 0.0–0.2)

## 2022-06-19 MED ORDER — PREDNISONE 10 MG PO TABS: ORAL_TABLET | ORAL | 0 refills | Status: AC

## 2022-06-19 MED ORDER — METHOCARBAMOL 1000 MG/10ML IJ SOLN
INTRAMUSCULAR | Status: AC
  Filled 2022-06-19: qty 10

## 2022-06-19 MED ORDER — METHOCARBAMOL 500 MG PO TABS: 500.0000 mg | ORAL_TABLET | Freq: Three times a day (TID) | ORAL | 0 refills | Status: AC | PRN

## 2022-06-19 MED ORDER — INFLUENZA VAC SPLIT QUAD 0.5 ML IM SUSY
0.5000 mL | PREFILLED_SYRINGE | Freq: Once | INTRAMUSCULAR | Status: AC
  Administered 2022-06-19: 0.5 mL via INTRAMUSCULAR
  Filled 2022-06-19: qty 0.5

## 2022-06-19 MED ORDER — INFLUENZA VAC SPLIT QUAD 0.5 ML IM SUSY: 0.5000 mL | PREFILLED_SYRINGE | INTRAMUSCULAR | Status: DC

## 2022-06-19 NOTE — Evaluation (Signed)
Physical Therapy Evaluation Patient Details Name: Donald Mcdaniel MRN: [id number] DOB: 12-21-1985 Today's Date: 06/19/2022  History of Present Illness  Donald Mcdaniel  is a 36 y.o. male, without significant past medical history, who works as Microbiologist, he was lifting one of the medical bags, all of a sudden he got immediate burning, painful sensation in the right lower middle back, radiating down to the right leg, did not hear any popping sensation, for extreme pain when he did walk around 20 steps, where he almost had to lower himself to his knees secondary to pain, he denies any focal weakness, tingling or numbness, no bladder incontinence, fever, chills, no bowel incontinence, given extreme pain he had to come to ED.  -In ED MRI significant for mild degenerative disease, he did receive IV Dilaudid, IV Robaxin, Vicodin, IV Decadron, and IV Toradol, despite that remains with significant pain, so [facility] hospitalist consulted to admit   Clinical Impression  Patient demonstrates slow labored movement for rolling to side and sitting up at bedside using bed rail, very unsteady on feet with difficulty extending trunk and locking knees, required use of axillary crutches for safety demonstrating good return for use with mostly step-to gait pattern without loss of balance and tolerated sitting up in chair after therapy.  PLAN:  Patient to be discharged home today and discharged from acute physical therapy to care of nursing for ambulation as tolerated for length of stay with recommendations stated below         Recommendations for follow up therapy are one component of a multi-disciplinary discharge planning process, led by the attending physician.  Recommendations may be updated based on patient status, additional functional criteria and insurance authorization.  Follow Up Recommendations Outpatient PT      Assistance Recommended at Discharge Set up Supervision/Assistance  Patient can return home with the  following  A little help with walking and/or transfers;A little help with bathing/dressing/bathroom;Assistance with cooking/housework;Help with stairs or ramp for entrance    Equipment Recommendations Crutches;Other (comment) (Axillary Crutches)  Recommendations for Other Services       Functional Status Assessment Patient has had a recent decline in their functional status and demonstrates the ability to make significant improvements in function in a reasonable and predictable amount of time.     Precautions / Restrictions Precautions Precautions: Fall Restrictions Weight Bearing Restrictions: No      Mobility  Bed Mobility Overal bed mobility: Needs Assistance Bed Mobility: Supine to Sit, Rolling Rolling: Supervision   Supine to sit: Supervision, Modified independent (Device/Increase time)     General bed mobility comments: as per OT notes    Transfers Overall transfer level: Needs assistance Equipment used: Crutches Transfers: Sit to/from Stand, Bed to chair/wheelchair/BSC Sit to Stand: Min guard   Step pivot transfers: Min guard       General transfer comment: as per OT notes    Ambulation/Gait Ambulation/Gait assistance: Supervision, Modified independent (Device/Increase time) Gait Distance (Feet): 100 Feet Assistive device: Crutches Gait Pattern/deviations: Step-to pattern, Decreased step length - left, Decreased stance time - right, Decreased stride length Gait velocity: decreased     General Gait Details: slow labored cadence with mostly 4 point gait pattern using axillary crutches without loss of balance and low back pain mostly at baseline  Stairs            Wheelchair Mobility    Modified Rankin (Stroke Patients Only)       Balance Overall balance assessment: Needs assistance Sitting-balance support: Feet  supported, No upper extremity supported Sitting balance-Leahy Scale: Good Sitting balance - Comments: seated EOB   Standing  balance support: During functional activity, No upper extremity supported Standing balance-Leahy Scale: Poor Standing balance comment: fair/good using Axillary Crutches                             Pertinent Vitals/Pain Pain Assessment Pain Assessment: 0-10 Pain Score: 3  Pain Location: Center of back in L5 area. Pain Descriptors / Indicators: Tightness, Shooting Pain Intervention(s): Limited activity within patient's tolerance, Monitored during session, Repositioned, Premedicated before session    [name] expects to be discharged to:: Private residence Living Arrangements: Spouse/significant other Available Help at Discharge: Family;Available PRN/intermittently Type of Home: House Home Access: Stairs to enter Entrance Stairs-Rails: None Entrance Stairs-Number of Steps: 1   Home Layout: Two level;Able to live on main level with bedroom/bathroom Home Equipment: None Additional Comments: Pt reports his wife will be available all weekend but he is not sure after that.    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, Hydrographic surveyor, drives ADLs Comments: Independent     Hand Dominance   Dominant Hand: Right    Extremity/Trunk Assessment   Upper Extremity Assessment Upper Extremity Assessment: Defer to OT evaluation    Lower Extremity Assessment Lower Extremity Assessment: Overall WFL for tasks assessed    Cervical / Trunk Assessment Cervical / Trunk Assessment: Normal  Communication   Communication: No difficulties  Cognition Arousal/Alertness: Awake/alert Behavior During Therapy: WFL for tasks assessed/performed Overall Cognitive Status: Within Functional Limits for tasks assessed                                          General Comments      Exercises     Assessment/Plan    PT Assessment All further PT needs can be met in the  next venue of care  PT Problem List Decreased strength;Decreased activity tolerance;Decreased balance;Decreased mobility       PT Treatment Interventions      PT Goals (Current goals can be found in the Care Plan section)  Acute Rehab PT Goals Patient Stated Goal: return home with family to assist PT Goal Formulation: With patient Time For Goal Achievement: 06/19/22 Potential to Achieve Goals: Good    Frequency       Co-evaluation PT/OT/SLP Co-Evaluation/Treatment: Yes Reason for Co-Treatment: To address functional/ADL transfers PT goals addressed during session: Mobility/safety with mobility;Balance;Proper use of DME OT goals addressed during session: ADL's and self-care       AM-PAC PT "6 Clicks" Mobility  Outcome Measure Help needed turning from your back to your side while in a flat bed without using bedrails?: None Help needed moving from lying on your back to sitting on the side of a flat bed without using bedrails?: A Little Help needed moving to and from a bed to a chair (including a wheelchair)?: A Little Help needed standing up from a chair using your arms (e.g., wheelchair or bedside chair)?: A Little Help needed to walk in hospital room?: A Little Help needed climbing 3-5 steps with a railing? : A Little 6 Click Score: 19    End of Session   Activity Tolerance: Patient tolerated treatment well;Patient limited  by pain Patient left: in chair;with call bell/phone within reach Nurse Communication: Mobility status PT Visit Diagnosis: Unsteadiness on feet (R26.81);Repeated falls (R29.6);Muscle weakness (generalized) (M62.81)    Time: 7703-4035 PT Time Calculation (min) (ACUTE ONLY): 30 min   Charges:   PT Evaluation $PT Eval Moderate Complexity: 1 Mod PT Treatments $Therapeutic Activity: 23-37 mins        11:48 AM, 06/19/22 Lonell Grandchild, MPT Physical Therapist with [facility] 336 [phone] office [phone] mobile phone

## 2022-06-19 NOTE — Progress Notes (Signed)
Patient has been in bed in supine position most of the night. He has been given Vicodin once for pain  as well as scheduled pain medications.

## 2022-06-19 NOTE — Discharge Summary (Signed)
Physician Discharge Summary   Patient: Donald Mcdaniel MRN: [id number] DOB: 1985-10-10  Admit date:     06/18/2022  Discharge date: 06/19/22  Discharge Physician: Jacquelin Hawking, MD   PCP: Blair Heys, MD   Recommendations at discharge:  PCP follow-up for hospital follow-up.  Discharge Diagnoses: Principal Problem:   Lumbago  Hospital Course: Donald Mcdaniel is a 36 y.o. male with no significant past medical history.  Patient presented secondary to acute back pain after attempting to lift a heavy medical bag.  Patient with severe back pain and unable to ambulate.  MRI was obtained which ruled out acute fracture and was significant only for mild L5-S1 degenerative disease.  Patient treated symptomatically with oral analgesics, muscle relaxers, topical analgesics.  Patient improved to the point of being able to mobilize on day of discharge.  Patient worked with physical therapy recommended outpatient physical therapy and crutches.   Consultants: None Procedures performed: None Disposition: Home Diet recommendation: Regular diet  DISCHARGE MEDICATION: Allergies as of 06/19/2022   No Known Allergies      Medication List     STOP taking these medications    Aleve 220 MG tablet Generic drug: naproxen sodium       TAKE these medications    aspirin EC 81 MG tablet Take 81 mg by mouth 2 (two) times daily as needed for mild pain.   ibuprofen 600 MG tablet Commonly known as: ADVIL Take 1 tablet (600 mg total) by mouth every 6 (six) hours as needed.   lidocaine 5 % Commonly known as: Lidoderm Place 1 patch onto the skin daily. Remove & Discard patch within 12 hours or as directed by MD   methocarbamol 500 MG tablet Commonly known as: ROBAXIN Take 1 tablet (500 mg total) by mouth every 8 (eight) hours as needed for muscle spasms.   predniSONE 10 MG tablet Commonly known as: DELTASONE Take 4 tablets (40 mg total) by mouth daily with breakfast for 1 day, THEN 3 tablets (30 mg  total) daily with breakfast for 1 day, THEN 2 tablets (20 mg total) daily with breakfast for 1 day, THEN 1 tablet (10 mg total) daily with breakfast for 1 day. Start taking on: June 20, 2022        Follow-up Information     Blair Heys, MD In 1 week.   Specialty: Family Medicine Contact information: [address] [place] [address] [phone]                Discharge Exam: BP 98/63 (BP Location: Right Arm)   Pulse 70   Temp 98 F (36.7 C) (Oral)   Resp 20   Ht 5\' 10"  (1.778 m)   Wt 81.1 kg   SpO2 98%   BMI 25.65 kg/m   General exam: Appears calm and comfortable Respiratory system: Clear to auscultation. Respiratory effort normal. Cardiovascular system: S1 & S2 heard, RRR. No murmurs, rubs, gallops or clicks. Gastrointestinal system: Abdomen is nondistended, soft and nontender. No organomegaly or masses felt. Normal bowel sounds heard. Central nervous system: Alert and oriented. No focal neurological deficits. Musculoskeletal: No edema. No calf tenderness Skin: No cyanosis. No rashes Psychiatry: Judgement and insight appear normal. Mood & affect appropriate.   Condition at discharge: stable  The results of significant diagnostics from this hospitalization (including imaging, microbiology, ancillary and laboratory) are listed below for reference.   Imaging Studies: MR LUMBAR SPINE WO CONTRAST  Result Date: 06/18/2022 CLINICAL DATA:  Low back pain, cauda  equina syndrome suspected EXAM: MRI LUMBAR SPINE WITHOUT CONTRAST TECHNIQUE: Multiplanar, multisequence MR imaging of the lumbar spine was performed. No intravenous contrast was administered. COMPARISON:  None Available. FINDINGS: Segmentation:  Standard. Alignment: Straightening of lumbar lordosis. No significant anteroposterior listhesis. Vertebrae: Vertebral body heights are maintained. No marrow edema. No suspicious osseous lesion. Conus medullaris and cauda equina: Conus extends to  the L2 level. Conus and cauda equina appear normal. Paraspinal and other soft tissues: Unremarkable. Disc levels: Intervertebral disc heights and signal are maintained from L1-L2 through L4-L5. No disc herniation or stenosis at these levels. L5-S1: Disc desiccation and minor height loss. Shallow left central/subarticular protrusion superimposed on a minimal disc bulge. No canal or foraminal stenosis. Slight narrowing of the left subarticular recess without definite nerve root compression. 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 Electronically Signed   By: Guadlupe Spanish M.D.   On: 06/18/2022 13:05   DG Lumbar Spine Complete  Result Date: 06/18/2022 CLINICAL DATA:  Low back pain. 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 EXAM: LUMBAR SPINE - COMPLETE 4+ VIEW COMPARISON:  None Available. FINDINGS: There is no evidence of lumbar spine fracture. Straightening of the lumbar spine. Intervertebral disc spaces are maintained. IMPRESSION: 1. No acute fracture or traumatic subluxation. 2. Straightening of the lumbar spine, which could be postural. Electronically Signed   By: Larose Hires D.O.   On: 06/18/2022 09:59    Microbiology: Results for orders placed or performed during the hospital encounter of 08/07/19  Novel Coronavirus, NAA (Hosp order, Send-out to Ref Lab; TAT 18-24 hrs     Status: None   Collection Time: 08/07/19  9:19 AM   Specimen: Nasopharyngeal Swab; Respiratory  Result Value Ref Range Status   SARS-CoV-2, NAA NOT DETECTED NOT DETECTED Final    Comment: (NOTE) This nucleic acid amplification test was developed and its performance characteristics determined by [employer]. Nucleic acid amplification tests include PCR and TMA. This test has not been FDA cleared or approved. This test has been authorized by FDA under an Emergency Use Authorization (EUA). This test is only authorized for the  duration of time the declaration that circumstances exist justifying the authorization of the emergency use of in vitro diagnostic tests for detection of SARS-CoV-2 virus and/or diagnosis of COVID-19 infection under section 564(b)(1) of the Act, 21 U.S.C. 756EPP-2(R) (1), unless the authorization is terminated or revoked sooner. When diagnostic testing is negative, the possibility of a false negative result should be considered in the context of a patient's recent exposures and the presence of clinical signs and symptoms consistent with COVID-19. An individual without symptoms of COVID- 19 and who is not shedding SARS-CoV-2 vi rus would expect to have a negative (not detected) result in this assay. Performed At: [facility] [address] [place], [place] [id number] Jolene Schimke MD YT:[id number]    Coronavirus Source NASOPHARYNGEAL  Final    Comment: Performed at [facility], [address], [place], [place] [address]    Labs: CBC: Recent Labs  Lab 06/18/22 1139 06/19/22 0329  WBC 6.0 8.2  NEUTROABS 4.1  --   HGB 15.9 15.9  HCT 46.2 46.3  MCV 87.7 88.0  PLT 178 176   Basic Metabolic Panel: Recent Labs  Lab 06/18/22 1139 06/19/22 0329  NA 139 138  K 3.7 5.0  CL 105 106  CO2 24 25  GLUCOSE 92 129*  BUN 18 19  CREATININE 1.05 0.93  CALCIUM 9.5 9.2    Discharge time spent: 35 minutes.  Signed: Jacquelin Hawking, MD [facility] 06/19/2022

## 2022-06-19 NOTE — Hospital Course (Signed)
Donald Mcdaniel is a 36 y.o. male with no significant past medical history.  Patient presented secondary to acute back pain after attempting to lift a heavy medical bag.  Patient with severe back pain and unable to ambulate.  MRI was obtained which ruled out acute fracture and was significant only for mild L5-S1 degenerative disease.  Patient treated symptomatically with oral analgesics, muscle relaxers, topical analgesics.  Patient improved to the point of being able to mobilize on day of discharge.  Patient worked with physical therapy recommended outpatient physical therapy and crutches.

## 2022-06-19 NOTE — Evaluation (Signed)
Occupational Therapy Evaluation Patient Details Name: Donald Mcdaniel MRN: [id number] DOB: 01-26-1986 Today's Date: 06/19/2022   History of Present Illness Donald Mcdaniel  is a 36 y.o. male, without significant past medical history, who works as Therapist, nutritional, he was lifting one of the medical bags, all of a sudden he got immediate burning, painful sensation in the right lower middle back, radiating down to the right leg, did not hear any popping sensation, for extreme pain when he did walk around 20 steps, where he almost had to lower himself to his knees secondary to pain, he denies any focal weakness, tingling or numbness, no bladder incontinence, fever, chills, no bowel incontinence, given extreme pain he had to come to ED.  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 Pt works as Therapist, nutritional at baseline. Today pt is needing assist for lower body ADL tasks and is unsteady in standing with improvement in balance and pain if using crutches. Pt reports that his wife will be present this weekend and that he thinks he will be okay without home  health OT services. Pt is not recommended for further acute OT services and will be discharged to care of nursing staff for remaining length of stay.      Recommendations for follow up therapy are one component of a multi-disciplinary discharge planning process, led by the attending physician.  Recommendations may be updated based on patient status, additional functional criteria and insurance authorization.   Follow Up Recommendations  No OT follow up    Assistance Recommended at Discharge Intermittent Supervision/Assistance  Patient can return home with the following A little help with walking and/or transfers;A lot of help with  bathing/dressing/bathroom;Assist for transportation;Help with stairs or ramp for entrance    Functional Status Assessment  Patient has had a recent decline in their functional status and demonstrates the ability to make significant improvements in function in a reasonable and predictable amount of time.  Equipment Recommendations  None recommended by OT    Recommendations for Other Services       Precautions / Restrictions Precautions Precautions: Fall Restrictions Weight Bearing Restrictions: No      Mobility Bed Mobility Overal bed mobility: Needs Assistance Bed Mobility: Supine to Sit, Rolling Rolling: Supervision   Supine to sit: Supervision, Modified independent (Device/Increase time)     General bed mobility comments: using rail to roll to L side; labored movement with increased pain.    Transfers Overall transfer level: Needs assistance Equipment used: Crutches Transfers: Sit to/from Stand, Bed to chair/wheelchair/BSC Sit to Stand: Min guard     Step pivot transfers: Min guard     General transfer comment: Pt unsteady with very slow labored movement without crutches. Improved pace and ease of transfer with them.      Balance Overall balance assessment: Needs assistance Sitting-balance support: No upper extremity supported, Feet supported Sitting balance-Leahy Scale: Good Sitting balance - Comments: seated EOB   Standing balance support: Bilateral upper extremity supported, During functional activity Standing balance-Leahy Scale: Fair Standing balance comment: using crutches                           ADL either performed or assessed with clinical judgement   ADL Overall ADL's :  Needs assistance/impaired     Grooming: Set up;Sitting   Upper Body Bathing: Set up;Sitting   Lower Body Bathing: Moderate assistance;Sitting/lateral leans   Upper Body Dressing : Set up;Sitting   Lower Body Dressing: Sitting/lateral leans;Maximal  assistance Lower Body Dressing Details (indicate cue type and reason): max A to don socks seated at EOB today. Toilet Transfer: Min guard;Ambulation (crutches) Toilet Transfer Details (indicate cue type and reason): simualted via EOB to chait transfer with ambulation in hall as well. Toileting- Clothing Manipulation and Hygiene: Moderate assistance;Sitting/lateral lean;Sit to/from stand       Functional mobility during ADLs: Min guard (crutches)       Vision Baseline Vision/History: 1 Wears glasses Ability to See in Adequate Light: 0 Adequate Patient Visual Report: No change from baseline Vision Assessment?: No apparent visual deficits                Pertinent Vitals/Pain Pain Assessment Pain Assessment: 0-10 Pain Score: 3  Pain Location: Center of back in L5 area. Pain Descriptors / Indicators: Tightness, Shooting Pain Intervention(s): Limited activity within patient's tolerance, Monitored during session, Repositioned     Hand Dominance Right   Extremity/Trunk Assessment Upper Extremity Assessment Upper Extremity Assessment: Overall WFL for tasks assessed (Notief to have increase pain during MMT but good functional use of B UE.)   Lower Extremity Assessment Lower Extremity Assessment: Defer to PT evaluation   Cervical / Trunk Assessment Cervical / Trunk Assessment: Normal   Communication Communication Communication: No difficulties   Cognition Arousal/Alertness: Awake/alert Behavior During Therapy: WFL for tasks assessed/performed Overall Cognitive Status: Within Functional Limits for tasks assessed                                                        Home Living Family/patient expects to be discharged to:: Private residence Living Arrangements: Spouse/significant other Available Help at Discharge: Family;Available PRN/intermittently Type of Home: House Home Access: Stairs to enter [employer] of Steps: 1 Entrance  Stairs-Rails: None Home Layout: Two level;Able to live on main level with bedroom/bathroom     Bathroom Shower/Tub: Producer, television/film/video: Standard Bathroom Accessibility: Yes How Accessible: Accessible via walker Home Equipment: None   Additional Comments: Pt reports his wife will be available all weekend but he is not sure after that.      Prior Functioning/Environment Prior Level of Function : Independent/Modified Independent             Mobility Comments: Worked for EMS                                  Co-evaluation PT/OT/SLP Co-Evaluation/Treatment: Yes Reason for Co-Treatment: To address functional/ADL transfers   OT goals addressed during session: ADL's and self-care      AM-PAC OT "6 Clicks" Daily Activity     Outcome Measure Help from another person eating meals?: None Help from another person taking care of personal grooming?: A Little Help from another person toileting, which includes using toliet, bedpan, or urinal?: A Little Help from another person bathing (including washing, rinsing, drying)?: A Lot Help from another person to put on and taking off regular upper body clothing?: A Little Help from another person to put on and taking off regular lower body  clothing?: A Lot 6 Click Score: 17   End of Session Equipment Utilized During Treatment: Other (comment) (crutches)  Activity Tolerance: Patient tolerated treatment well Patient left: in chair;with call bell/phone within reach;Other (comment) (doctor present)  OT Visit Diagnosis: Unsteadiness on feet (R26.81);Other abnormalities of gait and mobility (R26.89);Pain Pain - part of body:  (back)                Time: [id number] OT Time Calculation (min): 22 min Charges:  OT General Charges $OT Visit: 1 Visit OT Evaluation $OT Eval Low Complexity: 1 Low  Malin Sambrano OT, MOT  Larey Seat 06/19/2022, 9:38 AM

## 2022-06-19 NOTE — TOC Transition Note (Signed)
Transition of Care [facility]) - CM/SW Discharge Note   Patient Details  Name: Donald Mcdaniel MRN: [id number] Date of Birth: 20-Jan-1986  [facility] of [facility]) CM/SW Contact:  Villa Herb, LCSWA Phone Number: 06/19/2022, 11:52 AM   Clinical Narrative:    CSW updated that PT is recommending OP PT for pt. CSW spoke with pt about this he is agreeable to referral being made to [facility] OP PT. CSW made referral. Pt also in need of crutches, they have been ordered by PT and delivered to pt in room. TOC signing off.   Final next level of care: OP Rehab Barriers to Discharge: Barriers Resolved   Patient Goals and CMS Choice Patient states their goals for this hospitalization and ongoing recovery are:: return home CMS Medicare.gov Compare Post Acute Care list provided to:: Patient Choice offered to / list presented to : Patient  Discharge Placement                       Discharge Plan and Services                DME Arranged: Crutches                    Social Determinants of Health (SDOH) Interventions     Readmission Risk Interventions     No data to display

## 2022-11-13 IMAGING — DX DG SHOULDER 2+V*L*
4 series · 4 of 4 positions shown · non-contrast
Comparison: None.

CLINICAL DATA: Fall on shoulder 3 days ago with persistent pain,
initial encounter

EXAM:
LEFT SHOULDER - 2+ VIEW

[shoulder ap]
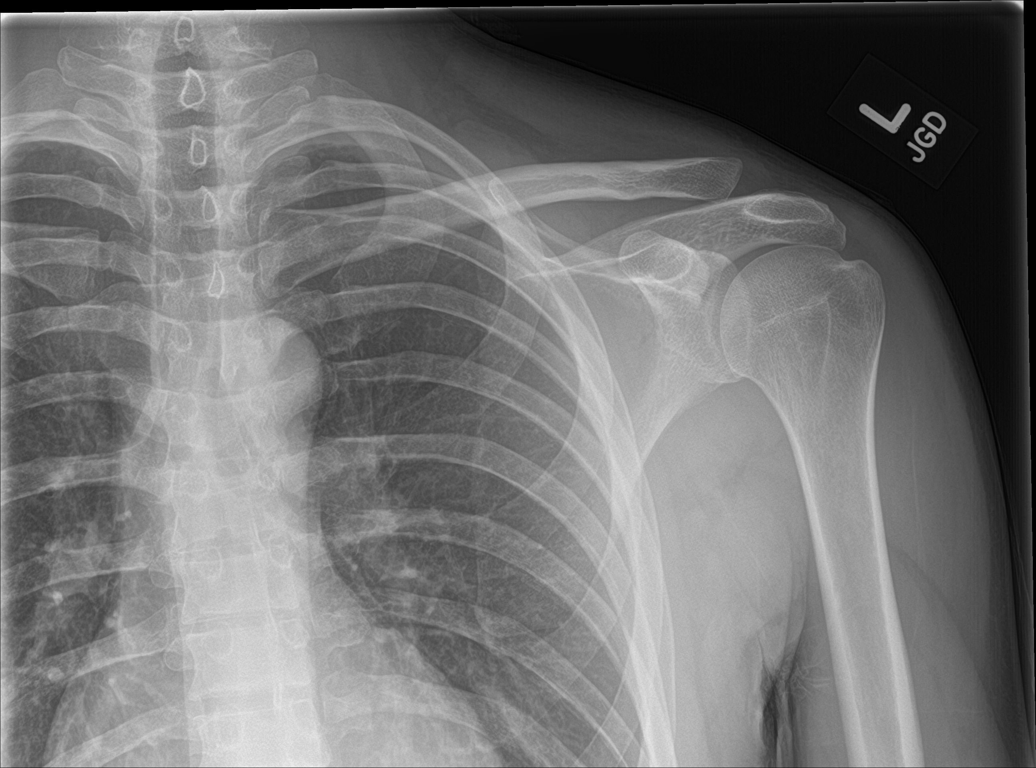

[shoulder grashey]
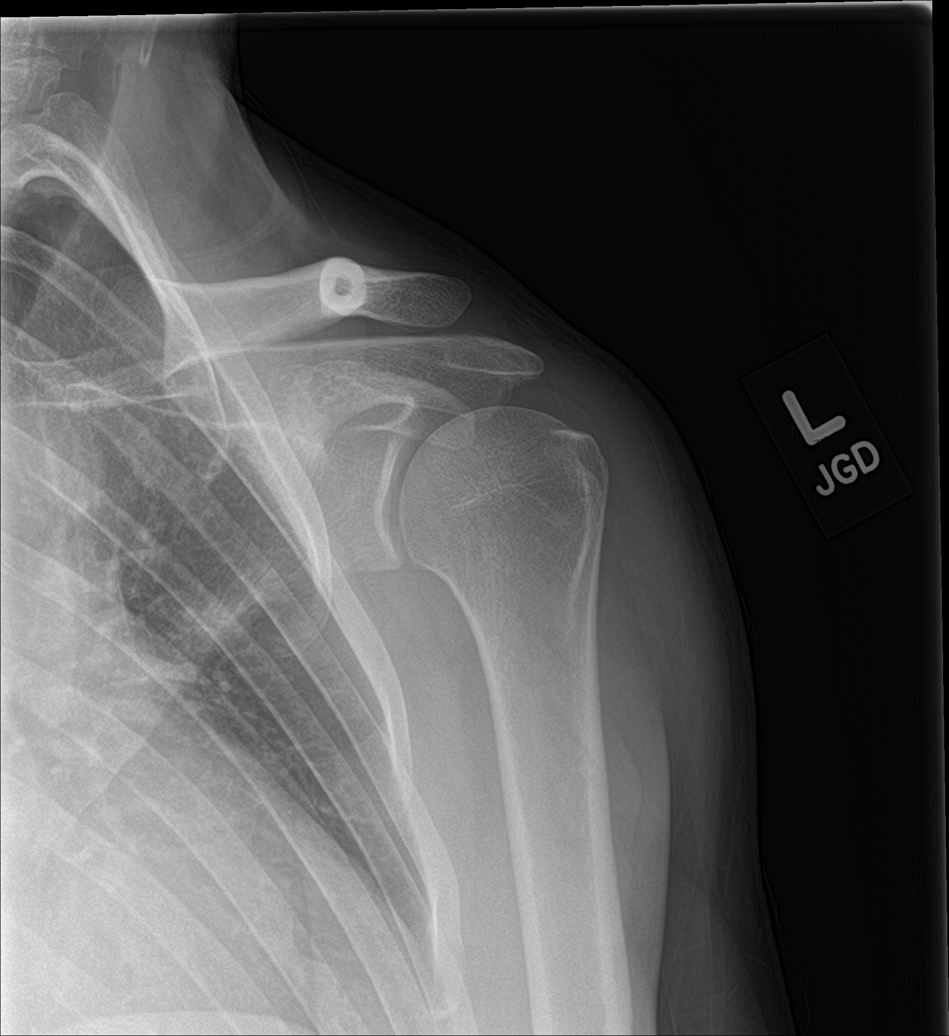

[shoulder y-view]
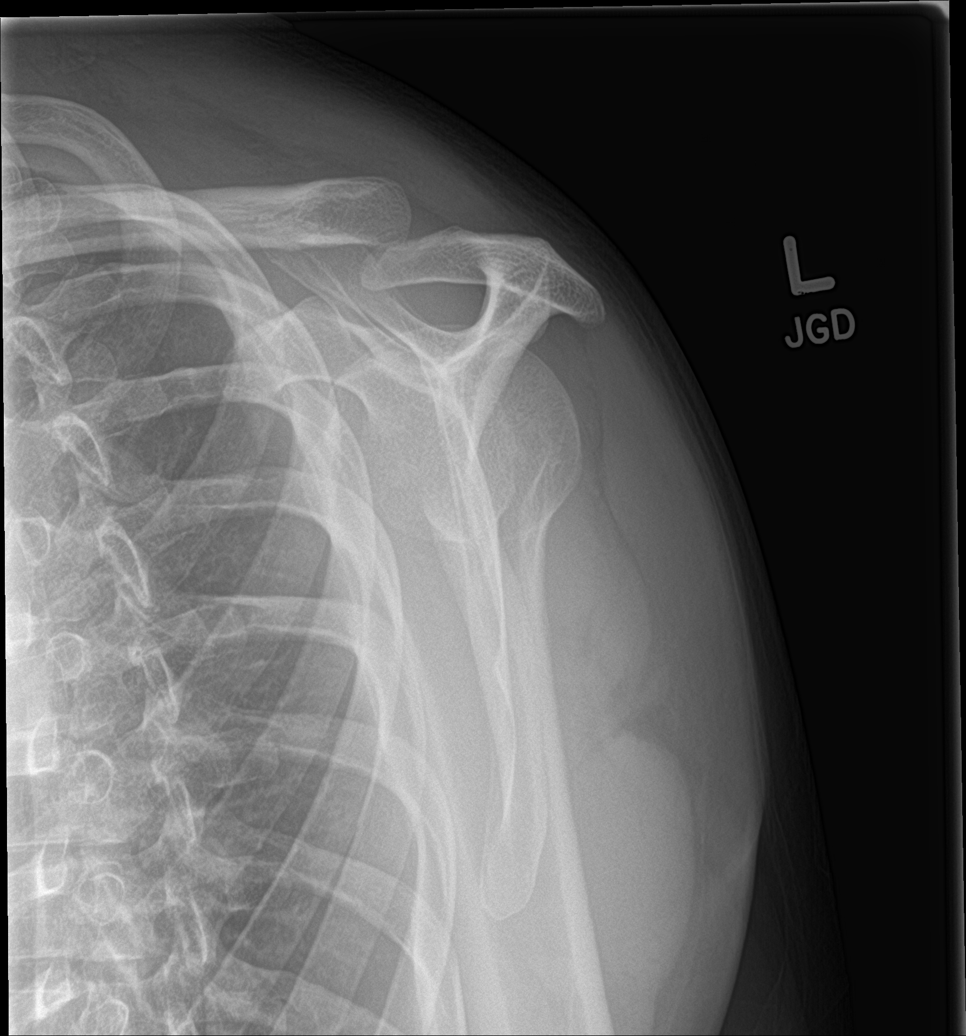

[shoulder axial]
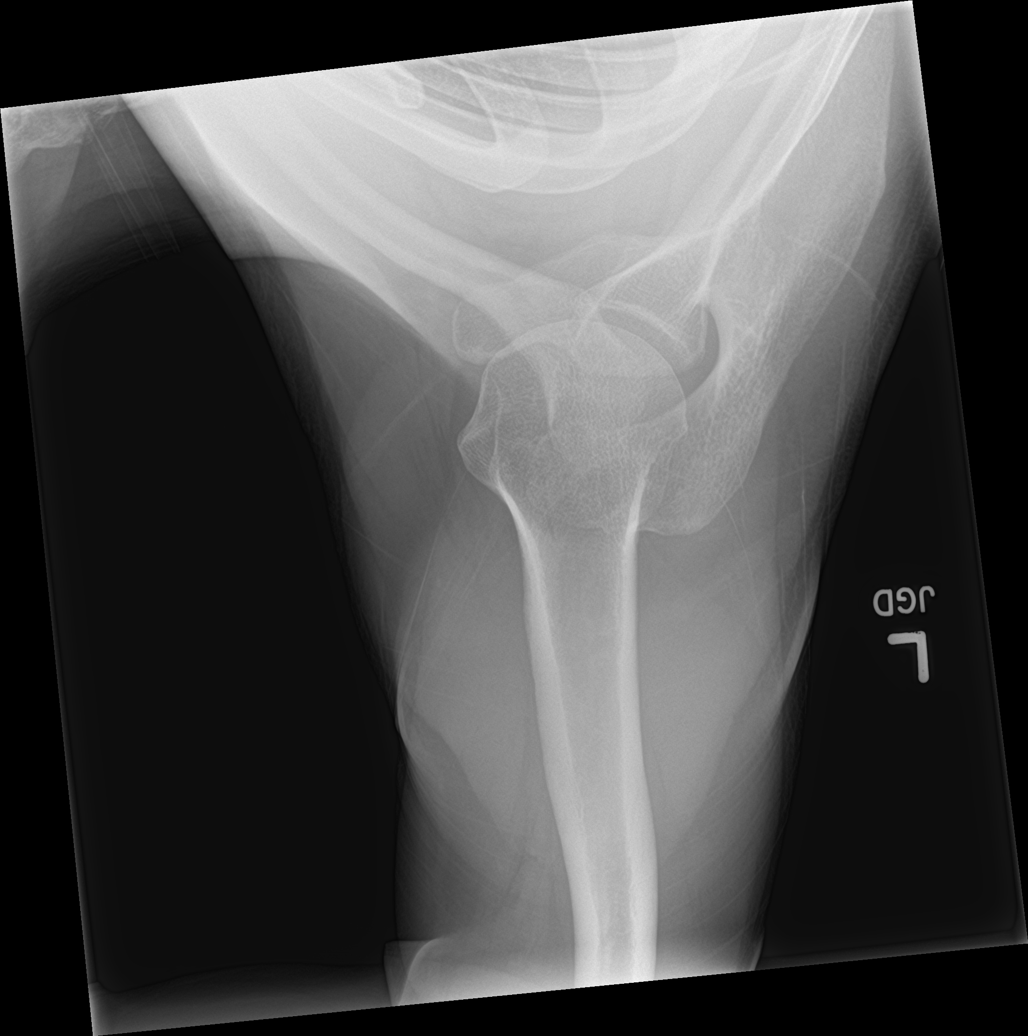

[4 of 4 positions shown; findings below may reference images not displayed]

FINDINGS: Humeral head is well seated. No acute fracture is noted. Some
elevation of the clavicle with respect to the acromion is seen
suspicious for mild acromioclavicular separation. No other focal
abnormality is noted.
IMPRESSION: Mild AC separation.
